# Patient Record
Sex: Female | Born: 1960 | Race: White | Hispanic: No | Marital: Married | Smoking: Current every day smoker
Health system: Southern US, Community
[De-identification: ages and names within clinical notes are randomized; demographics above are authoritative.]

## PROBLEM LIST (undated history)

## (undated) DIAGNOSIS — K219 Gastro-esophageal reflux disease without esophagitis: Secondary | ICD-10-CM

## (undated) DIAGNOSIS — I10 Essential (primary) hypertension: Secondary | ICD-10-CM

---

## 2019-07-14 ENCOUNTER — Encounter: Payer: Self-pay | Admitting: Emergency Medicine

## 2019-07-14 ENCOUNTER — Emergency Department: Payer: Managed Care, Other (non HMO)

## 2019-07-14 ENCOUNTER — Inpatient Hospital Stay
Admission: EM | Admit: 2019-07-14 | Discharge: 2019-07-17 | DRG: 641 | Disposition: A | Discharge status: Home / Self Care | Payer: Managed Care, Other (non HMO) | Attending: Family Medicine | Admitting: Family Medicine

## 2019-07-14 ENCOUNTER — Other Ambulatory Visit: Payer: Self-pay

## 2019-07-14 ENCOUNTER — Ambulatory Visit (INDEPENDENT_AMBULATORY_CARE_PROVIDER_SITE_OTHER)
Admission: EM | Admit: 2019-07-14 | Discharge: 2019-07-14 | Disposition: A | Discharge status: Home / Self Care | Payer: Managed Care, Other (non HMO) | Source: Home / Self Care | Attending: Family Medicine | Admitting: Family Medicine

## 2019-07-14 DIAGNOSIS — R200 Anesthesia of skin: Secondary | ICD-10-CM | POA: Diagnosis present

## 2019-07-14 DIAGNOSIS — E162 Hypoglycemia, unspecified: Secondary | ICD-10-CM | POA: Diagnosis present

## 2019-07-14 DIAGNOSIS — E871 Hypo-osmolality and hyponatremia: Secondary | ICD-10-CM | POA: Diagnosis not present

## 2019-07-14 DIAGNOSIS — Z20828 Contact with and (suspected) exposure to other viral communicable diseases: Secondary | ICD-10-CM | POA: Diagnosis present

## 2019-07-14 DIAGNOSIS — I10 Essential (primary) hypertension: Secondary | ICD-10-CM | POA: Diagnosis present

## 2019-07-14 DIAGNOSIS — Z8249 Family history of ischemic heart disease and other diseases of the circulatory system: Secondary | ICD-10-CM

## 2019-07-14 DIAGNOSIS — Z791 Long term (current) use of non-steroidal anti-inflammatories (NSAID): Secondary | ICD-10-CM

## 2019-07-14 DIAGNOSIS — F17210 Nicotine dependence, cigarettes, uncomplicated: Secondary | ICD-10-CM

## 2019-07-14 DIAGNOSIS — Z79899 Other long term (current) drug therapy: Secondary | ICD-10-CM

## 2019-07-14 DIAGNOSIS — E876 Hypokalemia: Secondary | ICD-10-CM | POA: Diagnosis present

## 2019-07-14 DIAGNOSIS — Z7951 Long term (current) use of inhaled steroids: Secondary | ICD-10-CM

## 2019-07-14 DIAGNOSIS — N179 Acute kidney failure, unspecified: Secondary | ICD-10-CM | POA: Diagnosis present

## 2019-07-14 DIAGNOSIS — K219 Gastro-esophageal reflux disease without esophagitis: Secondary | ICD-10-CM | POA: Diagnosis present

## 2019-07-14 DIAGNOSIS — F17200 Nicotine dependence, unspecified, uncomplicated: Secondary | ICD-10-CM | POA: Diagnosis present

## 2019-07-14 DIAGNOSIS — R202 Paresthesia of skin: Secondary | ICD-10-CM | POA: Diagnosis present

## 2019-07-14 HISTORY — DX: Gastro-esophageal reflux disease without esophagitis: K21.9

## 2019-07-14 HISTORY — DX: Essential (primary) hypertension: I10

## 2019-07-14 LAB — CBC WITH DIFFERENTIAL/PLATELET
Abs Immature Granulocytes: 0.11 10*3/uL — ABNORMAL HIGH (ref 0.00–0.07)
Basophils Absolute: 0.1 10*3/uL (ref 0.0–0.1)
Basophils Relative: 0 %
Eosinophils Absolute: 0 10*3/uL (ref 0.0–0.5)
Eosinophils Relative: 0 %
HCT: 39.4 % (ref 36.0–46.0)
Hemoglobin: 13.8 g/dL (ref 12.0–15.0)
Immature Granulocytes: 1 %
Lymphocytes Relative: 9 %
Lymphs Abs: 1.4 10*3/uL (ref 0.7–4.0)
MCH: 32.5 pg (ref 26.0–34.0)
MCHC: 35 g/dL (ref 30.0–36.0)
MCV: 92.7 fL (ref 80.0–100.0)
Monocytes Absolute: 1 10*3/uL (ref 0.1–1.0)
Monocytes Relative: 7 %
Neutro Abs: 12.6 10*3/uL — ABNORMAL HIGH (ref 1.7–7.7)
Neutrophils Relative %: 83 %
Platelets: 307 10*3/uL (ref 150–400)
RBC: 4.25 MIL/uL (ref 3.87–5.11)
RDW: 13.6 % (ref 11.5–15.5)
WBC: 15.3 10*3/uL — ABNORMAL HIGH (ref 4.0–10.5)
nRBC: 0 % (ref 0.0–0.2)

## 2019-07-14 LAB — COMPREHENSIVE METABOLIC PANEL
ALT: 36 U/L (ref 0–44)
AST: 40 U/L (ref 15–41)
Albumin: 4.2 g/dL (ref 3.5–5.0)
Alkaline Phosphatase: 54 U/L (ref 38–126)
Anion gap: 19 — ABNORMAL HIGH (ref 5–15)
BUN: 14 mg/dL (ref 6–20)
CO2: 18 mmol/L — ABNORMAL LOW (ref 22–32)
Calcium: 6 mg/dL — CL (ref 8.9–10.3)
Chloride: 93 mmol/L — ABNORMAL LOW (ref 98–111)
Creatinine, Ser: 1.28 mg/dL — ABNORMAL HIGH (ref 0.44–1.00)
GFR calc Af Amer: 53 mL/min — ABNORMAL LOW (ref >60)
GFR calc non Af Amer: 46 mL/min — ABNORMAL LOW (ref >60)
Glucose, Bld: 122 mg/dL — ABNORMAL HIGH (ref 70–99)
Potassium: 2.4 mmol/L — CL (ref 3.5–5.1)
Sodium: 130 mmol/L — ABNORMAL LOW (ref 135–145)
Total Bilirubin: 0.7 mg/dL (ref 0.3–1.2)
Total Protein: 7.9 g/dL (ref 6.5–8.1)

## 2019-07-14 LAB — GLUCOSE, CAPILLARY: Glucose-Capillary: 124 mg/dL — ABNORMAL HIGH (ref 70–99)

## 2019-07-14 LAB — MAGNESIUM
Magnesium: 0.5 mg/dL — CL (ref 1.7–2.4)
Magnesium: 1.9 mg/dL (ref 1.7–2.4)

## 2019-07-14 LAB — TROPONIN I (HIGH SENSITIVITY): Troponin I (High Sensitivity): 8 ng/L (ref <18)

## 2019-07-14 LAB — PHOSPHORUS
Phosphorus: 4.1 mg/dL (ref 2.5–4.6)
Phosphorus: 3 mg/dL (ref 2.5–4.6)

## 2019-07-14 LAB — POTASSIUM: Potassium: 2.5 mmol/L — CL (ref 3.5–5.1)

## 2019-07-14 MED ORDER — MAGNESIUM SULFATE 4 GM/100ML IV SOLN: 4.0000 g | Freq: Once | INTRAVENOUS | Status: DC

## 2019-07-14 MED ORDER — ALPRAZOLAM 0.25 MG PO TABS: 0.2500 mg | ORAL_TABLET | Freq: Two times a day (BID) | ORAL | Status: DC | PRN

## 2019-07-14 MED ORDER — ENOXAPARIN SODIUM 40 MG/0.4ML ~~LOC~~ SOLN
40.0000 mg | SUBCUTANEOUS | Status: DC
  Administered 2019-07-15 – 2019-07-16 (×3): 40 mg via SUBCUTANEOUS
  Filled 2019-07-14 (×2): qty 0.4

## 2019-07-14 MED ORDER — ACETAMINOPHEN 325 MG PO TABS: 650.0000 mg | ORAL_TABLET | Freq: Four times a day (QID) | ORAL | Status: DC | PRN

## 2019-07-14 MED ORDER — POTASSIUM CHLORIDE 10 MEQ/100ML IV SOLN: 10.0000 meq | INTRAVENOUS | Status: DC

## 2019-07-14 MED ORDER — POTASSIUM CHLORIDE CRYS ER 20 MEQ PO TBCR: 40.0000 meq | EXTENDED_RELEASE_TABLET | Freq: Once | ORAL | Status: DC

## 2019-07-14 MED ORDER — ADULT MULTIVITAMIN W/MINERALS CH
1.0000 | ORAL_TABLET | Freq: Every day | ORAL | Status: DC
  Administered 2019-07-15 – 2019-07-17 (×3): 1 via ORAL
  Filled 2019-07-14 (×3): qty 1

## 2019-07-14 MED ORDER — CALCIUM GLUCONATE-NACL 1-0.675 GM/50ML-% IV SOLN
1.0000 g | Freq: Once | INTRAVENOUS | Status: AC
  Administered 2019-07-14: 1000 mg via INTRAVENOUS
  Filled 2019-07-14: qty 50

## 2019-07-14 MED ORDER — ACETAMINOPHEN 650 MG RE SUPP
650.0000 mg | Freq: Four times a day (QID) | RECTAL | Status: DC | PRN
  Filled 2019-07-14: qty 1

## 2019-07-14 MED ORDER — LORAZEPAM 2 MG PO TABS: 0.0000 mg | ORAL_TABLET | Freq: Two times a day (BID) | ORAL | Status: DC

## 2019-07-14 MED ORDER — LORAZEPAM 2 MG PO TABS: 0.0000 mg | ORAL_TABLET | Freq: Four times a day (QID) | ORAL | Status: AC

## 2019-07-14 MED ORDER — FOLIC ACID 1 MG PO TABS
1.0000 mg | ORAL_TABLET | Freq: Every day | ORAL | Status: DC
  Administered 2019-07-15 – 2019-07-17 (×3): 1 mg via ORAL
  Filled 2019-07-14 (×3): qty 1

## 2019-07-14 MED ORDER — POTASSIUM CHLORIDE 10 MEQ/100ML IV SOLN
10.0000 meq | INTRAVENOUS | Status: AC
  Administered 2019-07-14 (×3): 10 meq via INTRAVENOUS
  Filled 2019-07-14 (×3): qty 100

## 2019-07-14 MED ORDER — ONDANSETRON HCL 4 MG PO TABS: 4.0000 mg | ORAL_TABLET | Freq: Four times a day (QID) | ORAL | Status: DC | PRN

## 2019-07-14 MED ORDER — MAGNESIUM SULFATE 2 GM/50ML IV SOLN: 2.0000 g | Freq: Once | INTRAVENOUS | Status: DC

## 2019-07-14 MED ORDER — POTASSIUM CHLORIDE IN NACL 20-0.9 MEQ/L-% IV SOLN
INTRAVENOUS | Status: DC
  Administered 2019-07-14 – 2019-07-16 (×4): via INTRAVENOUS
  Filled 2019-07-14 (×8): qty 1000

## 2019-07-14 MED ORDER — VITAMIN B-1 100 MG PO TABS
100.0000 mg | ORAL_TABLET | Freq: Every day | ORAL | Status: DC
  Administered 2019-07-15 – 2019-07-17 (×3): 100 mg via ORAL
  Filled 2019-07-14 (×3): qty 1

## 2019-07-14 MED ORDER — MAGNESIUM SULFATE 4 GM/100ML IV SOLN
4.0000 g | Freq: Once | INTRAVENOUS | Status: AC
  Administered 2019-07-14: 4 g via INTRAVENOUS
  Filled 2019-07-14: qty 100

## 2019-07-14 MED ORDER — THIAMINE HCL 100 MG/ML IJ SOLN: 100.0000 mg | Freq: Every day | INTRAMUSCULAR | Status: DC

## 2019-07-14 MED ORDER — SODIUM CHLORIDE 0.9 % IV SOLN: 1.0000 g | Freq: Once | INTRAVENOUS | Status: DC

## 2019-07-14 MED ORDER — ONDANSETRON HCL 4 MG/2ML IJ SOLN: 4.0000 mg | Freq: Four times a day (QID) | INTRAMUSCULAR | Status: DC | PRN

## 2019-07-14 MED ORDER — SODIUM CHLORIDE 0.9 % IV BOLUS
1000.0000 mL | Freq: Once | INTRAVENOUS | Status: AC
  Administered 2019-07-14: 1000 mL via INTRAVENOUS

## 2019-07-14 NOTE — ED Notes (Signed)
Pt up to restroom to urinate. Will hang fluids/meds once back.

## 2019-07-14 NOTE — Consult Note (Signed)
PHARMACY CONSULT NOTE - FOLLOW UP  Pharmacy Consult for Electrolyte Monitoring and Replacement   Recent Labs: Potassium (mmol/L)  Date Value  07/14/2019 2.4 (LL)   Magnesium (mg/dL)  Date Value  07/14/2019 0.5 (LL)   Calcium (mg/dL)  Date Value  07/14/2019 6.0 (LL)   Albumin (g/dL)  Date Value  07/14/2019 4.2   Phosphorus (mg/dL)  Date Value  07/14/2019 4.1   Sodium (mmol/L)  Date Value  07/14/2019 130 (L)    Assessment: Patient is a 58 y/o F with history of HTN and GERD who presented to [facility] ED c/o numbness in face and hands. Patient reports drinking more alcohol than usual recently. Orders for multivitamin, thiamine, folate. Labs significant for multiple electrolyte abnormalities. Scr is elevated to 1.28.   Electrolyte repletion ordered in ED includes: -Potassium chloride 10 mEq IV x 3 -Potassium chloride 40 mEq PO x 1 -Calcium gluconate 1 g IV x1  Goal of Therapy:  Replete electrolytes to normal levels  Plan:  Hypokalemia: -NS + 20 mEq at 100 mL/hr -Re-check K at 2200  Hypomagnesemia: -Magnesium sulfate 4 g IV x 1  Hypocalcemia -Additional calcium gluconate 1 g IV x1  -BMP, Mg tomorrow AM  [place] Resident 07/14/2019 4:14 PM

## 2019-07-14 NOTE — Discharge Instructions (Signed)
Go to the ER for further evaluation.  Take care  Dr. Lacinda Axon

## 2019-07-14 NOTE — ED Triage Notes (Signed)
Patient c/o numbness in both hands and both sides of her face since yesterday.  Patient denies pain.

## 2019-07-14 NOTE — ED Notes (Signed)
Pt denies any needs currently. Bed remains locked low. Rail remains up. Call bell remains within reach. Pt watching tv.

## 2019-07-14 NOTE — ED Notes (Signed)
Pt speaking with this RN in NAD, reports feeling numb from eyes down to her knees x 2-3 days.

## 2019-07-14 NOTE — ED Provider Notes (Signed)
[facility] Emergency Department Provider Note  ____________________________________________   First MD Initiated Contact with Patient 07/14/19 1452     (approximate)  I have reviewed the triage vital signs and the nursing notes.   HISTORY  Chief Complaint Numbness (started Wednesday)    HPI Marlenne Ridge is a 58 y.o. female otherwise healthy presents with numbness.  Patient says the numbness is on her face below her eyebrows as well as her upper extremities.  Numbness is intermittent, moderate, nothing makes better, nothing makes it worse.  He has been associate with some ringing in her ears.  Patient has tried to put Q-tips into her ears to help with the ringing.  Denies excessive aspirin use.  Denies any weakness or difficulty speaking with it.  Denies having this previously.  Has not had basic labs done in over 10 years.     Past Medical History:  Diagnosis Date  . GERD (gastroesophageal reflux disease)   . Hypertension     There are no active problems to display for this patient.   No past surgical history on file.  Prior to Admission medications   Medication Sig Start Date End Date Taking? Authorizing Provider  ALPRAZolam (XANAX) 0.25 MG tablet Take 0.25 mg by mouth daily as needed. 07/03/19   [provider]  LISINOPRIL PO Take by mouth.    [provider]  METOPROLOL SUCCINATE PO Take by mouth.    [provider]    Allergies Codeine  Family History  Problem Relation Age of Onset  . Hypertension Mother   . Hypertension Father     Social History Social History   Tobacco Use  . Smoking status: Current Every Day Smoker    Types: Cigarettes  . Smokeless tobacco: Never Used  Substance Use Topics  . Alcohol use: Yes  . Drug use: Never      Review of Systems Constitutional: No fever/chills Eyes: No visual changes. ENT: No sore throat. Cardiovascular: Denies chest pain. Respiratory: Denies  shortness of breath. Gastrointestinal: No abdominal pain.  No nausea, no vomiting.  No diarrhea.  No constipation. Genitourinary: Negative for dysuria. Musculoskeletal: Negative for back pain. Skin: Negative for rash. Neurological: Negative for headaches, positive sensation changes. All other ROS negative ____________________________________________   PHYSICAL EXAM:  VITAL SIGNS: ED Triage Vitals  Enc Vitals Group     BP 07/14/19 1353 (!) 128/106     Pulse Rate 07/14/19 1353 79     Resp 07/14/19 1353 (!) 21     Temp 07/14/19 1353 98 F (36.7 C)     Temp Source 07/14/19 1353 Oral     SpO2 07/14/19 1353 98 %     Weight 07/14/19 1354 230 lb (104.3 kg)     Height 07/14/19 1354 5\' 8"  (1.727 m)     Head Circumference --      Peak Flow --      Pain Score 07/14/19 1354 0     Pain Loc --      Pain Edu? --      Excl. in [place]? --     Constitutional: Alert and oriented. Well appearing and in no acute distress. Eyes: Conjunctivae are normal. EOMI. Head: Atraumatic.  TMs slightly red with erythema on the left. No wax noted.  Nose: No congestion/rhinnorhea. Mouth/Throat: Mucous membranes are moist.   Neck: No stridor. Trachea Midline. FROM Cardiovascular: Normal rate, regular rhythm. Grossly normal heart sounds.  Good peripheral circulation. Respiratory: Normal respiratory effort.  No  retractions. Lungs CTAB. Gastrointestinal: Soft and nontender. No distention. No abdominal bruits.  Musculoskeletal: No lower extremity tenderness nor edema.  No joint effusions. Neurologic: Cranial nerves II through XII are intact.  No pronator drift.  Equal strength in arms and legs. Subjective tingling in bilateral arms and cheeks. Skin:  Skin is warm, dry and intact. No rash noted.  Negative chovstek sign.  Psychiatric: Mood and affect are normal. Speech and behavior are normal. GU: Deferred   ____________________________________________   LABS (all labs ordered are listed, but only abnormal results  are displayed)  Labs Reviewed  GLUCOSE, CAPILLARY - Abnormal; Notable for the following components:      Result Value   Glucose-Capillary 124 (*)    All other components within normal limits  CBC WITH DIFFERENTIAL/PLATELET - Abnormal; Notable for the following components:   WBC 15.3 (*)    Neutro Abs 12.6 (*)    Abs Immature Granulocytes 0.11 (*)    All other components within normal limits  COMPREHENSIVE METABOLIC PANEL - Abnormal; Notable for the following components:   Sodium 130 (*)    Potassium 2.4 (*)    Chloride 93 (*)    CO2 18 (*)    Glucose, Bld 122 (*)    Creatinine, Ser 1.28 (*)    Calcium 6.0 (*)    GFR calc non Af Amer 46 (*)    GFR calc Af Amer 53 (*)    Anion gap 19 (*)    All other components within normal limits  MAGNESIUM - Abnormal; Notable for the following components:   Magnesium 0.5 (*)    All other components within normal limits  SARS CORONAVIRUS 2 (TAT 6-24 HRS)  PHOSPHORUS  CALCIUM, IONIZED  PARATHYROID HORMONE, INTACT (NO CA)  CBG MONITORING, ED  TROPONIN I (HIGH SENSITIVITY)   ____________________________________________   ED ECG REPORT I, Concha Se, the attending physician, personally viewed and interpreted this ECG.  EKG is normal sinus rate of 96, no ST elevation, no T wave inversions, normal intervals. ____________________________________________  RADIOLOGY   Official radiology report(s): Ct Head Wo Contrast  Result Date: 07/14/2019 CLINICAL DATA:  Numbness and tingling, paresthesias, acute numbness from her neck down. No known injury. EXAM: CT HEAD WITHOUT CONTRAST TECHNIQUE: Contiguous axial images were obtained from the base of the skull through the vertex without intravenous contrast. COMPARISON:  None. FINDINGS: Brain: No evidence of acute infarction, hemorrhage, hydrocephalus, extra-axial collection or mass lesion/mass effect. 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 Vascular:  No hyperdense vessel or unexpected calcification. Skull: No osseous abnormality. Sinuses/Orbits: Visualized paranasal sinuses are clear. Visualized mastoid sinuses are clear. Visualized orbits demonstrate no focal abnormality. Other: None IMPRESSION: No acute intracranial pathology. Electronically Signed   By: Elige Ko   On: 07/14/2019 14:24    ____________________________________________   PROCEDURES  Procedure(s) performed (including Critical Care):  Procedures   ____________________________________________   INITIAL IMPRESSION / ASSESSMENT AND PLAN / ED COURSE  Zaiah Credeur was evaluated in Emergency Department on 07/14/2019 for the symptoms described in the history of present illness. She was evaluated in the context of the global COVID-19 pandemic, which necessitated consideration that the patient might be at risk for infection with the SARS-CoV-2 virus that causes COVID-19. Institutional protocols and algorithms that pertain to the evaluation of patients at risk for COVID-19 are in a state of rapid change based on information released by regulatory bodies including the CDC and federal and state organizations.  These policies and algorithms were followed during the patient's care in the ED.     Patient is a 58 year old who presents with face and arm numbness that is intermittent in nature.  Will get labs to evaluate for electrolyte abnormalities, AKI.  No focal weakness on exam to suggest stroke but CT head was ordered in triage to evaluate for intracranial hemorrhage versus mass.   Labs notable for K 2.4  Cr 1.28 And Ca 6.0  AG 19  Mag low at 0.5  White count elevated but no infectious symptoms.   We will add on ionized calcium that will not result for few days.  Will also add on magnesium and phosphorus.  Will begin to replete electrolytes.  Given significant electrolyte abnormalities causing symptoms will discuss with the hospital team for admission.     ____________________________________________   FINAL CLINICAL IMPRESSION(S) / ED DIAGNOSES   Final diagnoses:  Hypocalcemia  Hypokalemia  Hypomagnesemia  AKI (acute kidney injury) (HCC)      MEDICATIONS GIVEN DURING THIS VISIT:  Medications  sodium chloride 0.9 % bolus 1,000 mL (has no administration in time range)  potassium chloride SA (KLOR-CON) CR tablet 40 mEq (has no administration in time range)  potassium chloride 10 mEq in 100 mL IVPB (has no administration in time range)  calcium gluconate 1 g/ 50 mL sodium chloride IVPB (has no administration in time range)  magnesium sulfate IVPB 2 g 50 mL (has no administration in time range)     ED Discharge Orders    None       Note:  This document was prepared using Dragon voice recognition software and may include unintentional dictation errors.   Concha SeFunke, Aedin Jeansonne E, MD 07/14/19 1556

## 2019-07-14 NOTE — ED Notes (Signed)
This RN will call floor again in about 57mins.

## 2019-07-14 NOTE — ED Notes (Signed)
Pt up to restroom to urinate.  

## 2019-07-14 NOTE — H&P (Signed)
[place] [facility] [place] NAME: Mariah Bautista    MR#:  [id number]  DATE OF BIRTH:  [birth year]  DATE OF ADMISSION:  07/14/2019  PRIMARY CARE PHYSICIAN: Volanda Napoleon, MD   REQUESTING/REFERRING PHYSICIAN:Dr Jari Pigg  CHIEF COMPLAINT:   Feeling numbness all over the body for last few days  HISTORY OF PRESENT ILLNESS:  Mariah Bautista  is a 58 y.o. female with a known history of hypertension, Jerrye Bushy who is a daily alcohol drinker drinks few beers a day lately has been drinking more which she attributes to staying home more with COVID. Comes to the emergency room with weakness and generalized numbness all over. Denies any focal weakness vision disturbance or any dysphagia. Patient was found to have significant electrolyte abnormality with low potassium, sodium, magnesium and calcium. She is being admitted for further evaluation management. CT head is negative for stroke.  Denies any chest pain or shortness of breath.  PAST MEDICAL HISTORY:   Past Medical History:  Diagnosis Date  . GERD (gastroesophageal reflux disease)   . Hypertension     PAST SURGICAL HISTOIRY:  No past surgical history on file.  SOCIAL HISTORY:   Social History   Tobacco Use  . Smoking status: Current Every Day Smoker    Types: Cigarettes  . Smokeless tobacco: Never Used  Substance Use Topics  . Alcohol use: Yes    FAMILY HISTORY:   Family History  Problem Relation Age of Onset  . Hypertension Mother   . Hypertension Father     DRUG ALLERGIES:   Allergies  Allergen Reactions  . Codeine Nausea And Vomiting    REVIEW OF SYSTEMS:  Review of Systems  Constitutional: Negative for chills, fever and weight loss.  HENT: Negative for ear discharge, ear pain and nosebleeds.   Eyes: Negative for blurred vision, pain and discharge.  Respiratory: Negative for sputum production, shortness of breath, wheezing and stridor.   Cardiovascular: Negative for chest  pain, palpitations, orthopnea and PND.  Gastrointestinal: Negative for abdominal pain, diarrhea, nausea and vomiting.  Genitourinary: Negative for frequency and urgency.  Musculoskeletal: Negative for back pain and joint pain.  Neurological: Positive for tingling and weakness. Negative for sensory change, speech change and focal weakness.       Numbness  Psychiatric/Behavioral: Negative for depression and hallucinations. The patient is not nervous/anxious.      MEDICATIONS AT HOME:   Prior to Admission medications   Medication Sig Start Date End Date Taking? Authorizing Provider  ALPRAZolam (XANAX) 0.25 MG tablet Take 0.25 mg by mouth daily as needed. 07/03/19  Yes [provider]  cholecalciferol (VITAMIN D3) 25 MCG (1000 UT) tablet Take 1,000 Units by mouth daily.   Yes [provider]  fexofenadine (ALLEGRA) 180 MG tablet Take 180 mg by mouth daily.   Yes [provider]  fluticasone (FLONASE) 50 MCG/ACT nasal spray Place 1 spray into both nostrils daily.   Yes [provider]  lisinopril-hydrochlorothiazide (ZESTORETIC) 20-12.5 MG tablet Take 2 tablets by mouth daily.   Yes [provider]  metoprolol succinate (TOPROL XL) 100 MG 24 hr tablet Take 100 mg by mouth daily.   Yes [provider]  naproxen sodium (ALEVE) 220 MG tablet Take 440 mg by mouth daily as needed.   Yes [provider]  omeprazole (PRILOSEC) 40 MG capsule Take 40 mg by mouth daily.   Yes [provider]  vitamin C (ASCORBIC ACID) 250 MG tablet Take 250  mg by mouth daily.   Yes [provider]      VITAL SIGNS:  Blood pressure (!) 128/106, pulse 79, temperature 98 F (36.7 C), temperature source Oral, resp. rate (!) 21, height 5\' 8"  (1.727 m), weight 104.3 kg, SpO2 98 %.  PHYSICAL EXAMINATION:  GENERAL:  58 y.o.-year-old patient lying in the bed with no acute distress. Morbid obesity EYES: Pupils equal, round, reactive to light and  accommodation. No scleral icterus. Extraocular muscles intact.  HEENT: Head atraumatic, normocephalic. Oropharynx and nasopharynx clear.  NECK:  Supple, no jugular venous distention. No thyroid enlargement, no tenderness.  LUNGS: Normal breath sounds bilaterally, no wheezing, rales,rhonchi or crepitation. No use of accessory muscles of respiration.  CARDIOVASCULAR: S1, S2 normal. No murmurs, rubs, or gallops.  ABDOMEN: Soft, nontender, nondistended. Bowel sounds present. No organomegaly or mass.  EXTREMITIES: No pedal edema, cyanosis, or clubbing.  NEUROLOGIC: Cranial nerves II through XII are intact. Muscle strength 5/5 in all extremities. Sensation intact. Gait not checked.  PSYCHIATRIC: The patient is alert and oriented x 3.  SKIN: No obvious rash, lesion, or ulcer.   LABORATORY PANEL:   CBC Recent Labs  Lab 07/14/19 1358  WBC 15.3*  HGB 13.8  HCT 39.4  PLT 307   ------------------------------------------------------------------------------------------------------------------  Chemistries  Recent Labs  Lab 07/14/19 1358 07/14/19 1459  NA 130*  --   K 2.4*  --   CL 93*  --   CO2 18*  --   GLUCOSE 122*  --   BUN 14  --   CREATININE 1.28*  --   CALCIUM 6.0*  --   MG  --  0.5*  AST 40  --   ALT 36  --   ALKPHOS 54  --   BILITOT 0.7  --    ------------------------------------------------------------------------------------------------------------------  Cardiac Enzymes No results for input(s): TROPONINI in the last 168 hours. ------------------------------------------------------------------------------------------------------------------  RADIOLOGY:  Ct Head Wo Contrast  Result Date: 07/14/2019 CLINICAL DATA:  Numbness and tingling, paresthesias, acute numbness from her neck down. No known injury. EXAM: CT HEAD WITHOUT CONTRAST TECHNIQUE: Contiguous axial images were obtained from the base of the skull through the vertex without intravenous contrast. COMPARISON:   None. FINDINGS: Brain: No evidence of acute infarction, hemorrhage, hydrocephalus, extra-axial collection or mass lesion/mass effect. Small cystic area adjacent to the right thalamus likely reflecting a Norris CrossVirchow Robin space. Vascular: No hyperdense vessel or unexpected calcification. Skull: No osseous abnormality. Sinuses/Orbits: Visualized paranasal sinuses are clear. Visualized mastoid sinuses are clear. Visualized orbits demonstrate no focal abnormality. Other: None IMPRESSION: No acute intracranial pathology. Electronically Signed   By: Elige KoHetal  Kaiyan Luczak   On: 07/14/2019 14:24    EKG:    IMPRESSION AND PLAN:   Mariah Bautista  is a 58 y.o. female with a known history of hypertension, Genella RifeGerd who is a daily alcohol drinker drinks few beers a day lately has been drinking more which she attributes to staying home more with COVID. Complains of generalized numbness and weakness. Denies any tremors, focal weakness or dysphagia.  1. Generalized numbness with tingling suspected due to electrolyte abnormality -patient has hypokalemia, hypoglycemia, hypocalcemia -pharmacy consult place for electrolyte replacement both IV and oral -monitor metabolic panel -will give oral supplement at discharge -CT had negative for stroke.  2. Alcohol use on a daily basis which is increased recently which patient attributes to COVID -denies any DTs or alcohol related problems -CIWA protocol -watch for withdrawal -MVI, thiamine and folic acid  3. Hypertension - continue lisinopril  and metoprolol  4. Genella Rife continue PPI  5. DVT prophylaxis subcu Lovenox   All the records are reviewed and case discussed with ED provider.   CODE STATUS: full  TOTAL TIME TAKING CARE OF THIS PATIENT: *50* minutes.    Enedina Finner M.D on 07/14/2019 at 4:57 PM  Between 7am to 6pm - Pager - [phone]  After 6pm go to [email or web address] - password [email or web address] [facility]  [facility]  Office  [phone]  CC: Primary care physician;  [place] Bells, MD

## 2019-07-14 NOTE — ED Provider Notes (Signed)
[facility]    CSN: [id number] Arrival date & time: 07/14/19  1252  History   Chief Complaint Chief Complaint  Patient presents with  . Numbness    face and hands   HPI  58 year old female presents with the above complaint.   Patient reports that she developed diffuse numbness yesterday.  Has persisted today.  Patient states it is located essentially from her face down.  Does not affect the forehead.  It is bilateral.  She states that her face is numb as well as her arms.  She states that she has numbness of her legs particularly with prolonged sitting.  Denies weakness.  She has been able to do numerous household chores today without difficulty.  Patient denies being anxious but appears anxious currently.  Denies increase in stressors.  No known inciting factor.  No known exacerbating or relieving factors.  No other complaints.  PMH, Surgical Hx, Family Hx, Social History reviewed and updated as below.  Past Medical History:  Diagnosis Date  . GERD (gastroesophageal reflux disease)   . Hypertension   Tobacco abuse  History reviewed. No pertinent surgical history.  OB History   No obstetric history on file.    Home Medications    Prior to Admission medications   Medication Sig Start Date End Date Taking? Authorizing Provider  ALPRAZolam (XANAX) 0.25 MG tablet Take 0.25 mg by mouth daily as needed. 07/03/19  Yes [provider]  LISINOPRIL PO Take by mouth.   Yes [provider]  METOPROLOL SUCCINATE PO Take by mouth.   Yes [provider]    Family History Family History  Problem Relation Age of Onset  . Hypertension Mother   . Hypertension Father     Social History Social History   Tobacco Use  . Smoking status: Current Every Day Smoker    Types: Cigarettes  . Smokeless tobacco: Never Used  Substance Use Topics  . Alcohol use: Yes  . Drug use: Never    Allergies   Codeine   Review of Systems Review of Systems   Constitutional: Negative.   Neurological: Positive for numbness. Negative for weakness.   Physical Exam Triage Vital Signs ED Triage Vitals  Enc Vitals Group     BP 07/14/19 1255 126/82     Pulse Rate 07/14/19 1255 (!) 107     Resp 07/14/19 1255 16     Temp 07/14/19 1255 97.8 F (36.6 C)     Temp Source 07/14/19 1255 Oral     SpO2 07/14/19 1255 100 %     Weight 07/14/19 1255 230 lb (104.3 kg)     Height 07/14/19 1255 5\' 8"  (1.727 m)     Head Circumference --      Peak Flow --      Pain Score 07/14/19 1254 0     Pain Loc --      Pain Edu? --      Excl. in [place]? --    Updated Vital Signs BP 126/82 (BP Location: Left Arm)   Pulse (!) 107   Temp 97.8 F (36.6 C) (Oral)   Resp 16   Ht 5\' 8"  (1.727 m)   Wt 104.3 kg   SpO2 100%   BMI 34.97 kg/m   Visual Acuity Right Eye Distance:   Left Eye Distance:   Bilateral Distance:    Right Eye Near:   Left Eye Near:    Bilateral Near:     Physical Exam Vitals  signs and nursing note reviewed.  Constitutional:      General: She is not in acute distress.    Appearance: She is obese.  HENT:     Head: Normocephalic and atraumatic.  Eyes:     General:        Right eye: No discharge.        Left eye: No discharge.     Conjunctiva/sclera: Conjunctivae normal.  Cardiovascular:     Rate and Rhythm: Regular rhythm. Tachycardia present.  Pulmonary:     Effort: Pulmonary effort is normal.     Breath sounds: Normal breath sounds. No wheezing, rhonchi or rales.  Neurological:     Mental Status: She is alert and oriented to person, place, and time.     Comments: No appreciable weakness.  Psychiatric:     Comments: Appears anxious.    UC Treatments / Results  Labs (all labs ordered are listed, but only abnormal results are displayed) Labs Reviewed - No data to display  EKG   Radiology No results found.  Procedures Procedures (including critical care time)  Medications Ordered in UC Medications - No data to display   Initial Impression / Assessment and Plan / UC Course  I have reviewed the triage vital signs and the nursing notes.  Pertinent labs & imaging results that were available during my care of the patient were reviewed by me and considered in my medical decision making (see chart for details).    58 year old female presents with diffuse numbness.  Does not appear to be having an acute ischemic event.  There is no weakness or other neurological signs of stroke.  Patient anxious.  We discussed going to the ER versus watchful waiting as there is no focality to her exam.  Patient elected to go to the ER.  Patient is going via private vehicle.  Final Clinical Impressions(s) / UC Diagnoses   Final diagnoses:  Numbness     Discharge Instructions     Go to the ER for further evaluation.  Take care  Dr. Lacinda Axon    ED Prescriptions    None     PDMP not reviewed this encounter.   Coral Spikes, [place] 07/14/19 1339

## 2019-07-14 NOTE — ED Notes (Signed)
Pt up to use restroom. This RN to transport to room 102 once pt done.

## 2019-07-14 NOTE — ED Triage Notes (Signed)
PT arrives with complaints of numbness from face down that started Wednesday. Pt was able to feel sensation on both arms and legs upon assessment. No complaints of pain. No weakness present or reported. Pt a&o x 4 in triage. Smile symmetrical.

## 2019-07-14 NOTE — ED Notes (Signed)
SST tube sent to lab

## 2019-07-14 NOTE — ED Notes (Addendum)
Pt given food tray and drink. Watching tv. Denies any other needs.

## 2019-07-14 NOTE — ED Notes (Signed)
Ok to d/c 2nd trop per Dr. Funke 

## 2019-07-14 NOTE — ED Notes (Signed)
Pt denies burning from potassium; requesting pillow to place arm on since ac IV. Given pillow and arm placed on top. States feels much better this way. Denies any other needs.

## 2019-07-14 NOTE — ED Notes (Signed)
Pt given 2nd pillow to place at lower back as states the stretcher is uncomfortable. Given. States feels better once in place. Pt watching tv.

## 2019-07-15 LAB — MAGNESIUM: Magnesium: 1.6 mg/dL — ABNORMAL LOW (ref 1.7–2.4)

## 2019-07-15 LAB — BASIC METABOLIC PANEL WITH GFR
Anion gap: 14 (ref 5–15)
BUN: 8 mg/dL (ref 6–20)
CO2: 19 mmol/L — ABNORMAL LOW (ref 22–32)
Calcium: 6.2 mg/dL — CL (ref 8.9–10.3)
Chloride: 102 mmol/L (ref 98–111)
Creatinine, Ser: 1.04 mg/dL — ABNORMAL HIGH (ref 0.44–1.00)
GFR calc Af Amer: >60 mL/min
GFR calc non Af Amer: 59 mL/min — ABNORMAL LOW
Glucose, Bld: 114 mg/dL — ABNORMAL HIGH (ref 70–99)
Potassium: 2.6 mmol/L — CL (ref 3.5–5.1)
Sodium: 135 mmol/L (ref 135–145)

## 2019-07-15 LAB — SARS CORONAVIRUS 2 (TAT 6-24 HRS): SARS Coronavirus 2: NEGATIVE

## 2019-07-15 LAB — CALCIUM, IONIZED: Calcium, Ionized, Serum: <3 mg/dL — ABNORMAL LOW (ref 4.5–5.6)

## 2019-07-15 LAB — POTASSIUM: Potassium: 3.2 mmol/L — ABNORMAL LOW (ref 3.5–5.1)

## 2019-07-15 LAB — PARATHYROID HORMONE, INTACT (NO CA): PTH: 16 pg/mL (ref 15–65)

## 2019-07-15 MED ORDER — POTASSIUM CHLORIDE 10 MEQ/100ML IV SOLN: 10.0000 meq | INTRAVENOUS | Status: DC

## 2019-07-15 MED ORDER — POTASSIUM CHLORIDE 10 MEQ/100ML IV SOLN
10.0000 meq | INTRAVENOUS | Status: AC
  Administered 2019-07-15 (×2): 10 meq via INTRAVENOUS
  Filled 2019-07-15 (×2): qty 100

## 2019-07-15 MED ORDER — MAGNESIUM SULFATE 4 GM/100ML IV SOLN
4.0000 g | Freq: Once | INTRAVENOUS | Status: AC
  Administered 2019-07-15: 13:00:00 4 g via INTRAVENOUS
  Filled 2019-07-15: qty 100

## 2019-07-15 MED ORDER — POTASSIUM CHLORIDE 10 MEQ/100ML IV SOLN
10.0000 meq | INTRAVENOUS | Status: AC
  Administered 2019-07-15 (×2): 10 meq via INTRAVENOUS
  Filled 2019-07-15 (×2): qty 100

## 2019-07-15 MED ORDER — CALCIUM GLUCONATE-NACL 1-0.675 GM/50ML-% IV SOLN
1.0000 g | Freq: Once | INTRAVENOUS | Status: AC
  Administered 2019-07-15: 13:00:00 1000 mg via INTRAVENOUS
  Filled 2019-07-15: qty 50

## 2019-07-15 MED ORDER — POTASSIUM CHLORIDE CRYS ER 20 MEQ PO TBCR
20.0000 meq | EXTENDED_RELEASE_TABLET | ORAL | Status: AC
  Administered 2019-07-15 (×2): 20 meq via ORAL
  Filled 2019-07-15 (×2): qty 1

## 2019-07-15 MED ORDER — POTASSIUM CHLORIDE 10 MEQ/100ML IV SOLN
10.0000 meq | INTRAVENOUS | Status: AC
  Administered 2019-07-15 (×4): 10 meq via INTRAVENOUS
  Filled 2019-07-15: qty 100

## 2019-07-15 NOTE — Progress Notes (Signed)
[facility] [place] at [facility]   PATIENT NAME: Mariah Bautista    MR#:  [id number]  DATE OF BIRTH:  30-Jan-1961  SUBJECTIVE:  CHIEF COMPLAINT: Patient feels better than yesterday her numbness is improving.  Last drink was 4 days ago.  Denies any tremors or jitteriness at this point of time  REVIEW OF SYSTEMS:  CONSTITUTIONAL: No fever, fatigue or weakness.  EYES: No blurred or double vision.  EARS, NOSE, AND THROAT: No tinnitus or ear pain.  RESPIRATORY: No cough, shortness of breath, wheezing or hemoptysis.  CARDIOVASCULAR: No chest pain, orthopnea, edema.  GASTROINTESTINAL: No nausea, vomiting, diarrhea or abdominal pain.  GENITOURINARY: No dysuria, hematuria.  ENDOCRINE: No polyuria, nocturia,  HEMATOLOGY: No anemia, easy bruising or bleeding SKIN: No rash or lesion. MUSCULOSKELETAL: No joint pain or arthritis.   NEUROLOGIC: No tingling, numbness, weakness.  PSYCHIATRY: No anxiety or depression.   DRUG ALLERGIES:   Allergies  Allergen Reactions  . Codeine Nausea And Vomiting    VITALS:  Blood pressure 133/74, pulse 79, temperature 97.7 F (36.5 C), temperature source Oral, resp. rate 17, height 5\' 9"  (1.753 m), weight 102.7 kg, SpO2 98 %.  PHYSICAL EXAMINATION:  GENERAL:  58 y.o.-year-old patient lying in the bed with no acute distress.  EYES: Pupils equal, round, reactive to light and accommodation. No scleral icterus. Extraocular muscles intact.  HEENT: Head atraumatic, normocephalic. Oropharynx and nasopharynx clear.  NECK:  Supple, no jugular venous distention. No thyroid enlargement, no tenderness.  LUNGS: Normal breath sounds bilaterally, no wheezing, rales,rhonchi or crepitation. No use of accessory muscles of respiration.  CARDIOVASCULAR: S1, S2 normal. No murmurs, rubs, or gallops.  ABDOMEN: Soft, nontender, nondistended. Bowel sounds present.   EXTREMITIES: No pedal edema, cyanosis, or clubbing.  NEUROLOGIC: Cranial nerves II through  XII are intact. Muscle strength generalized weakness in all extremities. Sensation intact. Gait not checked.  PSYCHIATRIC: The patient is alert and oriented x 3.  SKIN: No obvious rash, lesion, or ulcer.    LABORATORY PANEL:   CBC Recent Labs  Lab 07/14/19 1358  WBC 15.3*  HGB 13.8  HCT 39.4  PLT 307   ------------------------------------------------------------------------------------------------------------------  Chemistries  Recent Labs  Lab 07/14/19 1358  07/15/19 0851  NA 130*  --  135  K 2.4*   < > 2.6*  CL 93*  --  102  CO2 18*  --  19*  GLUCOSE 122*  --  114*  BUN 14  --  8  CREATININE 1.28*  --  1.04*  CALCIUM 6.0*  --  6.2*  MG  --    < > 1.6*  AST 40  --   --   ALT 36  --   --   ALKPHOS 54  --   --   BILITOT 0.7  --   --    < > = values in this interval not displayed.   ------------------------------------------------------------------------------------------------------------------  Cardiac Enzymes No results for input(s): TROPONINI in the last 168 hours. ------------------------------------------------------------------------------------------------------------------  RADIOLOGY:  Ct Head Wo Contrast  Result Date: 07/14/2019 CLINICAL DATA:  Numbness and tingling, paresthesias, acute numbness from her neck down. No known injury. EXAM: CT HEAD WITHOUT CONTRAST TECHNIQUE: Contiguous axial images were obtained from the base of the skull through the vertex without intravenous contrast. COMPARISON:  None. FINDINGS: Brain: No evidence of acute infarction, hemorrhage, hydrocephalus, extra-axial collection or mass lesion/mass effect. Small cystic area adjacent to the right thalamus likely reflecting a 07/16/2019 space. Vascular: No hyperdense  vessel or unexpected calcification. Skull: No osseous abnormality. Sinuses/Orbits: Visualized paranasal sinuses are clear. Visualized mastoid sinuses are clear. Visualized orbits demonstrate no focal abnormality. Other:  None IMPRESSION: No acute intracranial pathology. Electronically Signed   By: Kathreen Devoid   On: 07/14/2019 14:24    EKG:   Orders placed or performed during the hospital encounter of 07/14/19  . ED EKG  . ED EKG    ASSESSMENT AND PLAN:   Mariah Bautista  is a 58 y.o. female with a known history of hypertension, Jerrye Bushy who is a daily alcohol drinker drinks few beers a day lately has been drinking more which she attributes to staying home more with COVID. Complains of generalized numbness and weakness. Denies any tremors, focal weakness or dysphagia.  1. Generalized numbness with tingling suspected due to electrolyte abnormality -patient has hypokalemia potassium at 2.6, hypomagnesemia 1.6, hypocalcemia with calcium and 6.2 -Hypoglycemia resolved -pharmacy consult placed for electrolyte replacement both IV and oral -will give oral supplement at discharge -CT had negative for stroke. -Clinically improving -Monitor electrolytes closely  2. Alcohol use on a daily basis which is increased recently which patient attributes to COVID -denies any DTs or alcohol related problems -CIWA protocol -watch for withdrawal -MVI, thiamine and folic acid -Outpatient alcohol [facility]  3. Hypertension - continue lisinopril and metoprolol  4. Jerrye Bushy continue PPI  5. DVT prophylaxis subcu Lovenox  PT assessment offered patient is refusing physical therapy assessment   All the records are reviewed and case discussed with Care Management/Social Workerr. Management plans discussed with the patient, family and they are in agreement.  CODE STATUS: fc  TOTAL TIME TAKING CARE OF THIS PATIENT: 36  minutes.   POSSIBLE D/C IN 1-2  DAYS, DEPENDING ON CLINICAL CONDITION.  Note: This dictation was prepared with Dragon dictation along with smaller phrase technology. Any transcriptional errors that result from this process are unintentional.   Nicholes Mango M.D on 07/15/2019 at 1:13 PM  Between 7am to  6pm - Pager - [phone] After 6pm go to [email or web address] - password [email or web address] [place] [facility]  Office  [phone]  CC: Primary care physician; Volanda Napoleon, MD

## 2019-07-15 NOTE — Plan of Care (Signed)

## 2019-07-15 NOTE — Progress Notes (Signed)
Patient's potassium 2.5, notified Dr. Jannifer Franklin, received verbal order. See orders.

## 2019-07-15 NOTE — Consult Note (Addendum)
PHARMACY CONSULT NOTE - FOLLOW UP  Pharmacy Consult for Electrolyte Monitoring and Replacement   Recent Labs: Potassium (mmol/L)  Date Value  07/15/2019 2.6 (LL)   Magnesium (mg/dL)  Date Value  07/15/2019 1.6 (L)   Calcium (mg/dL)  Date Value  07/15/2019 6.2 (LL)   Albumin (g/dL)  Date Value  07/14/2019 4.2   Phosphorus (mg/dL)  Date Value  07/14/2019 3.0   Sodium (mmol/L)  Date Value  07/15/2019 135    Assessment: Patient is a 58 y/o F with history of HTN and GERD who presented to [facility] ED c/o numbness in face and hands. Patient reports drinking more alcohol than usual recently. Orders for multivitamin, thiamine, folate. Labs significant for multiple electrolyte abnormalities. Scr is elevated to 1.28.   Electrolyte repletion ordered in ED includes: -Potassium chloride 10 mEq IV x 3 -Potassium chloride 40 mEq PO x 1 -Calcium gluconate 1 g IV x1  Goal of Therapy:  Replete electrolytes to normal levels  Plan:  Hypokalemia: K 2.6   Scr 1.04 -currently on NS + 20 mEq at 100 mL/hr -will order KCL 10 meq IV x 2 (over 2 hrs as pt didn't tolerate well previously) and KCL PO 20 Meq x 2 -f/u K at 2000  Hypomagnesemia: Mag 1.6 -Magnesium sulfate 4 g IV x 1 again today  Hypocalcemia :Ca 6.2 Albumin 4.2 -Additional calcium gluconate 1 g IV x1 again today  -BMP, Mg tomorrow AM  Chinita Greenland PharmD Clinical Pharmacist 07/15/2019

## 2019-07-15 NOTE — Consult Note (Addendum)
PHARMACY CONSULT NOTE - FOLLOW UP  Pharmacy Consult for Electrolyte Monitoring and Replacement   Recent Labs: Potassium (mmol/L)  Date Value  07/15/2019 3.2 (L)   Magnesium (mg/dL)  Date Value  07/15/2019 1.6 (L)   Calcium (mg/dL)  Date Value  07/15/2019 6.2 (LL)   Albumin (g/dL)  Date Value  07/14/2019 4.2   Phosphorus (mg/dL)  Date Value  07/14/2019 3.0   Sodium (mmol/L)  Date Value  07/15/2019 135    Assessment: Patient is a 58 y/o F with history of HTN and GERD who presented to [facility] ED c/o numbness in face and hands. Patient reports drinking more alcohol than usual recently. Orders for multivitamin, thiamine, folate. Labs significant for multiple electrolyte abnormalities. Scr is elevated to 1.28.   Electrolyte repletion ordered in ED includes: -Potassium chloride 10 mEq IV x 3 -Potassium chloride 40 mEq PO x 1 -Calcium gluconate 1 g IV x1  Goal of Therapy:  Replete electrolytes to normal levels  Plan:  Hypokalemia: K 3.2(@1800 )   Scr 1.04 -currently on NS + 20 mEq at 100 mL/hr -will order KCL 10 meq IV x 2 (over 2 hrs as pt didn't tolerate well previously)  -f/u K with AM labs  Hypomagnesemia: Mag 1.6 -Magnesium sulfate 4 g IV x 1 again today  Hypocalcemia :Ca 6.2 Albumin 4.2 -Additional calcium gluconate 1 g IV x1 again today  -BMP, Mg tomorrow AM  Chinita Greenland PharmD Clinical Pharmacist 07/15/2019

## 2019-07-15 NOTE — Progress Notes (Signed)
Patient unable to tolerate potassium rate at 174ml/hr, decreased rate to 105ml/hr. Discussed with lab, will reschedule lab draw for BMP.

## 2019-07-16 DIAGNOSIS — E876 Hypokalemia: Secondary | ICD-10-CM

## 2019-07-16 LAB — BASIC METABOLIC PANEL
Anion gap: 8 (ref 5–15)
BUN: 5 mg/dL — ABNORMAL LOW (ref 6–20)
CO2: 21 mmol/L — ABNORMAL LOW (ref 22–32)
Calcium: 6.7 mg/dL — ABNORMAL LOW (ref 8.9–10.3)
Chloride: 109 mmol/L (ref 98–111)
Creatinine, Ser: 0.76 mg/dL (ref 0.44–1.00)
GFR calc Af Amer: >60 mL/min (ref >60)
GFR calc non Af Amer: >60 mL/min (ref >60)
Glucose, Bld: 112 mg/dL — ABNORMAL HIGH (ref 70–99)
Potassium: 3.1 mmol/L — ABNORMAL LOW (ref 3.5–5.1)
Sodium: 138 mmol/L (ref 135–145)

## 2019-07-16 LAB — POTASSIUM: Potassium: 3.5 mmol/L (ref 3.5–5.1)

## 2019-07-16 LAB — MAGNESIUM: Magnesium: 2.1 mg/dL (ref 1.7–2.4)

## 2019-07-16 MED ORDER — SALINE SPRAY 0.65 % NA SOLN
1.0000 | NASAL | Status: DC | PRN
  Administered 2019-07-16: 1 via NASAL
  Filled 2019-07-16: qty 44

## 2019-07-16 MED ORDER — METOPROLOL SUCCINATE ER 50 MG PO TB24
100.0000 mg | ORAL_TABLET | Freq: Every day | ORAL | Status: DC
  Administered 2019-07-16 – 2019-07-17 (×2): 100 mg via ORAL
  Filled 2019-07-16 (×2): qty 2

## 2019-07-16 MED ORDER — CALCIUM GLUCONATE-NACL 1-0.675 GM/50ML-% IV SOLN
1.0000 g | Freq: Once | INTRAVENOUS | Status: AC
  Administered 2019-07-16: 09:00:00 1000 mg via INTRAVENOUS
  Filled 2019-07-16: qty 50

## 2019-07-16 MED ORDER — POTASSIUM CHLORIDE CRYS ER 20 MEQ PO TBCR
40.0000 meq | EXTENDED_RELEASE_TABLET | ORAL | Status: AC
  Administered 2019-07-16 (×2): 40 meq via ORAL
  Filled 2019-07-16 (×2): qty 2

## 2019-07-16 MED ORDER — LORATADINE 10 MG PO TABS
10.0000 mg | ORAL_TABLET | Freq: Every day | ORAL | Status: DC
  Administered 2019-07-16 – 2019-07-17 (×2): 10 mg via ORAL
  Filled 2019-07-16 (×2): qty 1

## 2019-07-16 NOTE — Progress Notes (Addendum)
  PROGRESS NOTE  Mariah Bautista FOY:[id number] DOB: May 10, 1961 DOA: 07/14/2019 PCP: Volanda Napoleon, MD  Brief History   58 year old woman daily alcohol use presented with weakness and generalized numbness, found to have multiple electrolyte abnormalities including hypokalemia, hyponatremia, hypomagnesemia and hypocalcemia.  A & P  Hypokalemia, hypocalcemia, likely secondary to poor solute intake/alcohol use/"beer drinker's potomania".  Presented with generalized numbness secondary to electrolyte abnormalities.  CT head was negative. --Magnesium within normal limits today.  Phosphorus was normal 48 hours ago. --Continue to supplement potassium and calcium.  Daily alcohol use --No evidence of withdrawal --Continue CIWA  Essential hypertension --Stable.  Resume Toprol-XL.  Hold lisinopril, HCTZ for now  DVT prophylaxis: enoxaparin Code Status: Full Family Communication: none Disposition Plan: home    Murray Hodgkins, MD  [facility] Direct contact: see [email or web address] (further directions at bottom of note if needed) 7PM-7AM contact night coverage as at bottom of note 07/16/2019, 10:56 AM  LOS: 2 days   Significant Hospital Events   . 10/30 admitted for multiple electrolyte abnormalities   Consults:  .    Procedures:  .   Significant Diagnostic Tests:  . Admission potassium 2.4, calcium 6, sodium 130, chloride 93 . CT head no acute abnormalities   Micro Data:  . SARS Covid negative   Antimicrobials:  .   Interval History/Subjective  Feels fine, no nausea or vomiting.  Objective   Vitals:  Vitals:   07/16/19 0349 07/16/19 0846  BP: (!) 152/74 (!) 145/80  Pulse: 81 89  Resp: 17   Temp: 98.5 F (36.9 C)   SpO2: 98% 98%    Exam:  Constitutional: Appears calm, comfortable resting in bed ENT: Grossly normal hearing Respiratory: Clear to auscultation bilaterally.  No wheezes, rales or rhonchi.  Normal respiratory effort. Cardiovascular: Regular rate and  rhythm.  No murmur, rub or gallop.  No lower extremity edema. Abdomen: Soft, nontender, nondistended Psychiatric: Grossly normal mood and affect.  Speech fluent and appropriate.  I have personally reviewed the following:   Today's Data  . Potassium 3.1 . Calcium 6.7 . Magnesium 2.1  Scheduled Meds: . enoxaparin (LOVENOX) injection  40 mg Subcutaneous Q24H  . folic acid  1 mg Oral Daily  . LORazepam  0-4 mg Oral Q6H   Followed by  . LORazepam  0-4 mg Oral Q12H  . metoprolol succinate  100 mg Oral Daily  . multivitamin with minerals  1 tablet Oral Daily  . potassium chloride  40 mEq Oral Q4H  . thiamine  100 mg Oral Daily   Or  . thiamine  100 mg Intravenous Daily   Continuous Infusions:   Principal Problem:   Hypokalemia Active Problems:   Numbness   Hypocalcemia   LOS: 2 days   How to contact the [facility] Attending or Consulting provider [place] or covering provider during after hours [place], for this patient?  1. Check the care team in [facility] and look for a) attending/consulting TRH provider listed and b) the [facility] team listed 2. Log into [email or web address] and use [place]'s universal password to access. If you do not have the password, please contact the hospital operator. 3. Locate the [facility] provider you are looking for under [facility] and page to a number that you can be directly reached. 4. If you still have difficulty reaching the provider, please page the [facility] (Director on Call) for the Hospitalists listed on amion for assistance.

## 2019-07-16 NOTE — Consult Note (Addendum)
PHARMACY CONSULT NOTE - FOLLOW UP  Pharmacy Consult for Electrolyte Monitoring and Replacement   Recent Labs: Potassium (mmol/L)  Date Value  07/16/2019 3.1 (L)   Magnesium (mg/dL)  Date Value  07/16/2019 2.1   Calcium (mg/dL)  Date Value  07/16/2019 6.7 (L)   Albumin (g/dL)  Date Value  07/14/2019 4.2   Phosphorus (mg/dL)  Date Value  07/14/2019 3.0   Sodium (mmol/L)  Date Value  07/16/2019 138    Assessment: Patient is a 58 y/o F with history of HTN and GERD who presented to [facility] ED c/o numbness in face and hands. Patient reports drinking more alcohol than usual recently. Orders for multivitamin, thiamine, folate. Labs significant for multiple electrolyte abnormalities. Scr is elevated to 1.28.    Goal of Therapy:  Replete electrolytes to normal levels  Plan:  Hypokalemia: K 3.1  Scr 0.76 -MD ordered KCL PO 40 meq q4h x 2 doses. -f/u K at 1800 and electrolytes with am labs  Hypomagnesemia: Mag 2.1 -no supplementation today  Hypocalcemia :Ca 6.7 Albumin 4.2 -MD ordered calcium gluconate 1 g IV x1 again today  -electrolytes tomorrow AM  Chinita Greenland PharmD Clinical Pharmacist 07/16/2019

## 2019-07-16 NOTE — TOC Initial Note (Signed)
Transition of Care [facility]) - Initial/Assessment Note    Patient Details  Name: Mariah Bautista MRN: [id number] Date of Birth: August 22, 1961  Transition of Care [facility]) CM/SW Contact:    Katrina Stack, RN Phone Number: 07/16/2019, 10:05 AM  Clinical Narrative:                CM consult present for "patoient drinking more than usual."  CM spoke with patient and she states that  during covid she has "probably drank more but  I'm okay." She is working full time. Denies issues with transportation, obtaining meds or accessing medical care.  She is on CIWA and scoring zero.  She declines needing any resources for etoh several times when offered.         Patient Goals and CMS Choice        Expected Discharge Plan and Services                                                Prior Living Arrangements/Services                       Activities of Daily Living Home Assistive Devices/Equipment: None ADL Screening (condition at time of admission) Patient's cognitive ability adequate to safely complete daily activities?: Yes Is the patient deaf or have difficulty hearing?: No Does the patient have difficulty seeing, even when wearing glasses/contacts?: No Does the patient have difficulty concentrating, remembering, or making decisions?: No Patient able to express need for assistance with ADLs?: Yes Does the patient have difficulty dressing or bathing?: No Independently performs ADLs?: Yes (appropriate for developmental age) Does the patient have difficulty walking or climbing stairs?: No Weakness of Legs: Both Weakness of Arms/Hands: Both  Permission Sought/Granted                  Emotional Assessment              Admission diagnosis:  Hypocalcemia [E83.51] Hypokalemia [E87.6] Hypomagnesemia [E83.42] AKI (acute kidney injury) ([place]) [N17.9] Patient Active Problem List   Diagnosis Date Noted  . Numbness 07/14/2019   PCP:  Volanda Napoleon, MD Pharmacy:    [facility] [place], [place] [facility] [address] AT [place] [facility] [place] [address] Phone: [phone] Fax: [phone]     Social Determinants of Health (SDOH) Interventions    Readmission Risk Interventions Readmission Risk Prevention Plan 07/16/2019  Post Dischage Appt Not Complete  Appt Comments No discharge at present  Medication Screening Complete  Transportation Screening Complete

## 2019-07-16 NOTE — Consult Note (Signed)
PHARMACY CONSULT NOTE - FOLLOW UP  Pharmacy Consult for Electrolyte Monitoring and Replacement   Recent Labs: Potassium (mmol/L)  Date Value  07/16/2019 3.5   Magnesium (mg/dL)  Date Value  07/16/2019 2.1   Calcium (mg/dL)  Date Value  07/16/2019 6.7 (L)   Albumin (g/dL)  Date Value  07/14/2019 4.2   Phosphorus (mg/dL)  Date Value  07/14/2019 3.0   Sodium (mmol/L)  Date Value  07/16/2019 138    Assessment: Patient is a 58 y/o F with history of HTN and GERD who presented to [facility] ED c/o numbness in face and hands. Patient reports drinking more alcohol than usual recently. Orders for multivitamin, thiamine, folate. Labs significant for multiple electrolyte abnormalities. Scr is elevated to 1.28.    Goal of Therapy:  Replete electrolytes to normal levels  Plan:  Hypokalemia: K 3.1  Scr 0.76 -MD ordered KCL PO 40 meq q4h x 2 doses. -f/u K at 1800 and electrolytes with am labs  11/1@1800 : K 3.5 -no further supplementation needed at this time.  Hypomagnesemia: Mag 2.1 -no supplementation today  Hypocalcemia :Ca 6.7 Albumin 4.2 -MD ordered calcium gluconate 1 g IV x1 again today  -electrolytes tomorrow AM  Pearla Dubonnet, PharmD Clinical Pharmacist 07/16/2019 6:45 PM

## 2019-07-16 NOTE — Plan of Care (Signed)

## 2019-07-17 LAB — BASIC METABOLIC PANEL
Anion gap: 7 (ref 5–15)
BUN: 5 mg/dL — ABNORMAL LOW (ref 6–20)
CO2: 20 mmol/L — ABNORMAL LOW (ref 22–32)
Calcium: 7.7 mg/dL — ABNORMAL LOW (ref 8.9–10.3)
Chloride: 110 mmol/L (ref 98–111)
Creatinine, Ser: 0.74 mg/dL (ref 0.44–1.00)
GFR calc Af Amer: >60 mL/min (ref >60)
GFR calc non Af Amer: >60 mL/min (ref >60)
Glucose, Bld: 90 mg/dL (ref 70–99)
Potassium: 3.6 mmol/L (ref 3.5–5.1)
Sodium: 137 mmol/L (ref 135–145)

## 2019-07-17 LAB — MAGNESIUM: Magnesium: 1.8 mg/dL (ref 1.7–2.4)

## 2019-07-17 LAB — PHOSPHORUS: Phosphorus: 2.5 mg/dL (ref 2.5–4.6)

## 2019-07-17 MED ORDER — SODIUM CHLORIDE 0.9 % IV SOLN
INTRAVENOUS | Status: DC | PRN
  Administered 2019-07-17: 08:00:00 250 mL via INTRAVENOUS

## 2019-07-17 MED ORDER — THIAMINE HCL 100 MG PO TABS: 100.0000 mg | ORAL_TABLET | Freq: Every day | ORAL | Status: AC

## 2019-07-17 MED ORDER — MAGNESIUM SULFATE 2 GM/50ML IV SOLN
2.0000 g | Freq: Once | INTRAVENOUS | Status: AC
  Administered 2019-07-17: 08:00:00 2 g via INTRAVENOUS
  Filled 2019-07-17: qty 50

## 2019-07-17 MED ORDER — POTASSIUM CHLORIDE CRYS ER 20 MEQ PO TBCR: 40.0000 meq | EXTENDED_RELEASE_TABLET | Freq: Once | ORAL | Status: DC

## 2019-07-17 MED ORDER — ADULT MULTIVITAMIN W/MINERALS CH: 1.0000 | ORAL_TABLET | Freq: Every day | ORAL | Status: AC

## 2019-07-17 MED ORDER — FOLIC ACID 1 MG PO TABS: 1.0000 mg | ORAL_TABLET | Freq: Every day | ORAL | Status: AC

## 2019-07-17 MED ORDER — CALCIUM GLUCONATE-NACL 1-0.675 GM/50ML-% IV SOLN
1.0000 g | Freq: Once | INTRAVENOUS | Status: AC
  Administered 2019-07-17: 1000 mg via INTRAVENOUS
  Filled 2019-07-17: qty 50

## 2019-07-17 MED ORDER — POTASSIUM PHOSPHATE MONOBASIC 500 MG PO TABS
500.0000 mg | ORAL_TABLET | Freq: Three times a day (TID) | ORAL | Status: DC
  Administered 2019-07-17 (×2): 500 mg via ORAL
  Filled 2019-07-17 (×3): qty 1

## 2019-07-17 NOTE — Progress Notes (Signed)
PROGRESS NOTE  Mariah Bautista RCV:[id number] DOB: 1961/06/26 DOA: 07/14/2019 PCP: Lake Bells, MD  Brief History   58 year old woman daily alcohol use presented with weakness and generalized numbness, found to have multiple electrolyte abnormalities including hypokalemia, hyponatremia, hypomagnesemia and hypocalcemia.  A & P  Hypokalemia, hypocalcemia, likely secondary to poor solute intake/alcohol use/"beer drinker's potomania".  Presented with generalized numbness secondary to electrolyte abnormalities.  CT head was negative. --Potassium, magnesium and phosphorus all within normal limits at lower end.  Calcium remains low at 7.7. --Replace potassium, magnesium, phosphorus, calcium  Diarrhea, no fever or abdominal pain.  Likely related to electrolyte repletion.  Exam benign. --Clinically.  Check this afternoon, hopefully discharge home.  Daily alcohol use --No evidence of withdrawal  Essential hypertension --Remains stable.  Continue Toprol-XL.  Stop hydrochlorothiazide on discharge.  Overall better, electrolytes stabilizing.  Following diarrhea clinically, she has no signs or symptoms to suggest bacterial infection.  Likely related to electrolyte replacement.  Home later today of diarrhea controlled.  DVT prophylaxis: enoxaparin Code Status: Full Family Communication: none Disposition Plan: home    Brendia Sacks, MD  [facility] Direct contact: see [email or web address] (further directions at bottom of note if needed) 7PM-7AM contact night coverage as at bottom of note 07/17/2019, 10:11 AM  LOS: 3 days   Significant Hospital Events   . 10/30 admitted for multiple electrolyte abnormalities   Consults:  .    Procedures:  .   Significant Diagnostic Tests:  . Admission potassium 2.4, calcium 6, sodium 130, chloride 93 . CT head no acute abnormalities   Micro Data:  . SARS Covid negative   Antimicrobials:  .   Interval History/Subjective  No nausea or vomiting.   Started having diarrhea last night and this morning.  No abdominal pain.  Watery.  Objective   Vitals:  Vitals:   07/17/19 0345 07/17/19 0729  BP: (!) 158/73 (!) 159/77  Pulse: 68 68  Resp: 17   Temp: 98 F (36.7 C)   SpO2: 95% 97%    Exam:  Constitutional: Appears calm, mildly uncomfortable, nontoxic. Eyes: Appear grossly unremarkable ENT: Grossly normal hearing Respiratory: Clear to auscultation bilaterally.  No wheezes, rales or rhonchi.  Normal respiratory effort. Cardiovascular: Regular rate and rhythm.  No murmur, rub or gallop.  No lower extremity edema Abdomen: Soft, nontender, nondistended.  Positive bowel sounds. Psychiatric: Grossly normal mood and affect.  Speech fluent and appropriate.  I have personally reviewed the following:   Today's Data  . Potassium 3.6, calcium 7.7, phosphorus 2.5, magnesium 1.8  Scheduled Meds: . enoxaparin (LOVENOX) injection  40 mg Subcutaneous Q24H  . folic acid  1 mg Oral Daily  . loratadine  10 mg Oral Daily  . LORazepam  0-4 mg Oral Q12H  . metoprolol succinate  100 mg Oral Daily  . multivitamin with minerals  1 tablet Oral Daily  . potassium phosphate (monobasic)  500 mg Oral TID WC & HS  . thiamine  100 mg Oral Daily   Or  . thiamine  100 mg Intravenous Daily   Continuous Infusions: . sodium chloride 250 mL (07/17/19 0750)    Principal Problem:   Hypokalemia Active Problems:   Numbness   Hypocalcemia   LOS: 3 days   How to contact the [facility] Attending or Consulting provider 7A - 7P or covering provider during after hours 7P -7A, for this patient?  1. Check the care team in [facility] and look for a) attending/consulting TRH provider listed and b) the  Center team listed 2. Log into [email or web address] and use [place]'s universal password to access. If you do not have the password, please contact the hospital operator. 3. Locate the [facility] provider you are looking for under [facility] and page to a number that you can be  directly reached. 4. If you still have difficulty reaching the provider, please page the [facility] (Director on Call) for the Hospitalists listed on amion for assistance.

## 2019-07-17 NOTE — Discharge Summary (Signed)
Physician Discharge Summary  Mariah Bautista NWG:[id number] DOB: [birth year] DOA: 07/14/2019  PCP: Volanda Napoleon, MD  Admit date: 07/14/2019 Discharge date: 07/17/2019  Recommendations for Outpatient Follow-up:   Hypokalemia, hypocalcemia, hypomagnesemia, hypophosphatemia --Consider repeat BMP and electrolyte studies in a week to ensure ability to maintain normal levels with diet.  Immoderate alcohol use --Follow-up in the outpatient setting  Follow-up Information    Volanda Napoleon, MD. Go on 07/25/2019.   Why: @3 :00 PM  Contact information: [place] [id number] [phone]            Discharge Diagnoses: Principal diagnosis is #1  1. Hypokalemia, hypocalcemia, hypophosphatemia, hypomagnesemia 2. Immoderate alcohol use 3. Essential hypertension  Discharge Condition: improved Disposition: home  Diet recommendation: heart healthy  Filed Weights   07/14/19 1354 07/14/19 2301  Weight: 104.3 kg 102.7 kg    History of present illness:  58 year old woman daily alcohol use presented with weakness and generalized numbness, found to have multiple electrolyte abnormalities including hypokalemia, hyponatremia, hypomagnesemia and hypocalcemia.  Hospital Course:  Patient had been eating poorly for some time, had suffered from diarrhea within the last 2 months and had been drinking substantial alcohol.  She was aggressively repleted for calcium magnesium phosphorus and potassium with gradual clinical improvement.  On discharge she reports she should be able to eat and drink fluids well.  Hypokalemia, hypocalcemia, likely secondary to poor solute intake/alcohol use/"beer drinker's potomania".  Presented with generalized numbness secondary to electrolyte abnormalities.  CT head was negative. --Potassium magnesium and phosphorus corrected.  Calcium now near normal.  Expect to be maintained with normal diarrhea in the outpatient setting.  Diarrhea, no fever or  abdominal pain.  Likely related to electrolyte repletion.  Exam benign. --Diarrhea starting to form up.  No evidence of infection, fever or significant pain.  No further evaluation suggested.  Immoderate alcohol use --No evidence of withdrawal --Follow-up as an outpatient  Essential hypertension --Remains stable.  Continue Toprol-XL.    Okay to resume hydrochlorothiazide so long as she maintains her diet.  Discharge home on vitamins including multivitamin, thiamine and folate.  Discussed need to continue calcium which the patient reports she already takes at home.  Significant Hospital Events    10/30 admitted for multiple electrolyte abnormalities  Significant Diagnostic Tests:   Admission potassium 2.4, calcium 6, sodium 130, chloride 93  CT head no acute abnormalities  Micro Data:   SARS Covid negative   Today's assessment: See progress note same day   Discharge Instructions  Discharge Instructions    Diet - low sodium heart healthy   Complete by: As directed    Discharge instructions   Complete by: As directed    Call your physician or seek immediate medical attention for weakness, numbness or worsening of condition.   Increase activity slowly   Complete by: As directed      Allergies as of 07/17/2019      Reactions   Codeine Nausea And Vomiting      Medication List    TAKE these medications   ALPRAZolam 0.25 MG tablet Commonly known as: XANAX Take 0.25 mg by mouth daily as needed.   cholecalciferol 25 MCG (1000 UT) tablet Commonly known as: VITAMIN D3 Take 1,000 Units by mouth daily.   fexofenadine 180 MG tablet Commonly known as: ALLEGRA Take 180 mg by mouth daily.   fluticasone 50 MCG/ACT nasal spray Commonly known as: FLONASE Place 1 spray into both nostrils daily.   folic acid 1  MG tablet Commonly known as: FOLVITE Take 1 tablet (1 mg total) by mouth daily. Start taking on: July 18, 2019   lisinopril-hydrochlorothiazide 20-12.5  MG tablet Commonly known as: ZESTORETIC Take 2 tablets by mouth daily.   multivitamin with minerals Tabs tablet Take 1 tablet by mouth daily. Start taking on: July 18, 2019   naproxen sodium 220 MG tablet Commonly known as: ALEVE Take 440 mg by mouth daily as needed.   omeprazole 40 MG capsule Commonly known as: PRILOSEC Take 40 mg by mouth daily.   thiamine 100 MG tablet Take 1 tablet (100 mg total) by mouth daily. Start taking on: July 18, 2019   Toprol XL 100 MG 24 hr tablet Generic drug: metoprolol succinate Take 100 mg by mouth daily.   vitamin C 250 MG tablet Commonly known as: ASCORBIC ACID Take 250 mg by mouth daily.      Allergies  Allergen Reactions  . Codeine Nausea And Vomiting    The results of significant diagnostics from this hospitalization (including imaging, microbiology, ancillary and laboratory) are listed below for reference.    Significant Diagnostic Studies: Ct Head Wo Contrast  Result Date: 07/14/2019 CLINICAL DATA:  Numbness and tingling, paresthesias, acute numbness from her neck down. No known injury. EXAM: CT HEAD WITHOUT CONTRAST TECHNIQUE: Contiguous axial images were obtained from the base of the skull through the vertex without intravenous contrast. COMPARISON:  None. FINDINGS: Brain: No evidence of acute infarction, hemorrhage, hydrocephalus, extra-axial collection or mass lesion/mass effect. 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 Vascular: No hyperdense vessel or unexpected calcification. Skull: No osseous abnormality. Sinuses/Orbits: Visualized paranasal sinuses are clear. Visualized mastoid sinuses are clear. Visualized orbits demonstrate no focal abnormality. Other: None IMPRESSION: No acute intracranial pathology. Electronically Signed   By: Elige Ko   On: 07/14/2019 14:24    Microbiology: Recent Results (from the past 240 hour(s))  SARS CORONAVIRUS 2 (TAT 6-24 HRS)  Nasopharyngeal Nasopharyngeal Swab     Status: None   Collection Time: 07/14/19  3:09 PM   Specimen: Nasopharyngeal Swab  Result Value Ref Range Status   SARS Coronavirus 2 NEGATIVE NEGATIVE Final    Comment: (NOTE) SARS-CoV-2 target nucleic acids are NOT DETECTED. The SARS-CoV-2 RNA is generally detectable in upper and lower respiratory specimens during the acute phase of infection. Negative results do not preclude SARS-CoV-2 infection, do not rule out co-infections with other pathogens, and should not be used as the sole basis for treatment or other patient management decisions. Negative results must be combined with clinical observations, patient history, and epidemiological information. The expected result is Negative. Fact Sheet for Patients: HairSlick.no Fact Sheet for Healthcare Providers: quierodirigir.com This test is not yet approved or cleared by the [place] FDA and  has been authorized for detection and/or diagnosis of SARS-CoV-2 by FDA under an Emergency Use Authorization (EUA). This EUA will remain  in effect (meaning this test can be used) for the duration of the COVID-19 declaration under Section 56 4(b)(1) of the Act, 21 U.S.C. section 360bbb-3(b)(1), unless the authorization is terminated or revoked sooner. Performed at [facility], [address], [place], [place] [address]      Labs: Basic Metabolic Panel: Recent Labs  Lab 07/14/19 1358 07/14/19 1459 07/14/19 2320 07/15/19 0851 07/15/19 1747 07/16/19 0622 07/16/19 1804 07/17/19 0403  NA 130*  --   --  135  --  138  --  137  K 2.4*  --  2.5* 2.6* 3.2* 3.1* 3.5 3.6  CL 93*  --   --  102  --  109  --  110  CO2 18*  --   --  19*  --  21*  --  20*  GLUCOSE 122*  --   --  114*  --  112*  --  90  BUN 14  --   --  8  --  5*  --  5*  CREATININE 1.28*  --   --  1.04*  --  0.76  --  0.74  CALCIUM 6.0*  --   --  6.2*  --  6.7*  --  7.7*  MG   --  0.5* 1.9 1.6*  --  2.1  --  1.8  PHOS 4.1  --  3.0  --   --   --   --  2.5   Liver Function Tests: Recent Labs  Lab 07/14/19 1358  AST 40  ALT 36  ALKPHOS 54  BILITOT 0.7  PROT 7.9  ALBUMIN 4.2   CBC: Recent Labs  Lab 07/14/19 1358  WBC 15.3*  NEUTROABS 12.6*  HGB 13.8  HCT 39.4  MCV 92.7  PLT 307   CBG: Recent Labs  Lab 07/14/19 1349  GLUCAP 124*    Principal Problem:   Hypokalemia Active Problems:   Numbness   Hypocalcemia   Time coordinating discharge: 35 minutes  Signed:  Brendia Sacksaniel Arelyn Gauer, MD  [facility]  07/17/2019, 5:37 PM

## 2019-07-17 NOTE — Plan of Care (Signed)

## 2021-04-07 IMAGING — CT CT HEAD W/O CM
3 series · 16 of 46 positions shown, 19 images · non-contrast
Comparison: None.

CLINICAL DATA: Numbness and tingling, paresthesias, acute numbness
from her neck down. No known injury.

EXAM:
CT HEAD WITHOUT CONTRAST
TECHNIQUE: Contiguous axial images were obtained from the base of the skull
through the vertex without intravenous contrast.

[Series 2: head wo · axial · 0.40mm/px · z∈[+506,+626]mm · 10 of 29 slices shown, 13 images]
[im 3/29  brain]
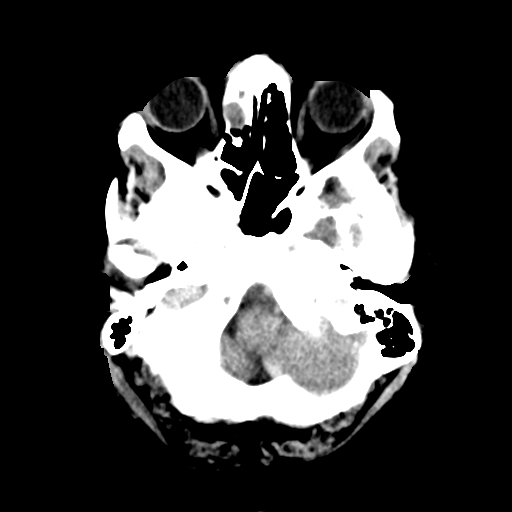
[im 3/29  bone]
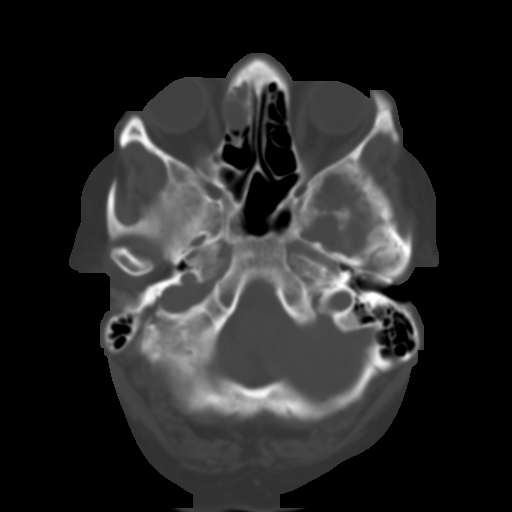
[im 6/29  brain]
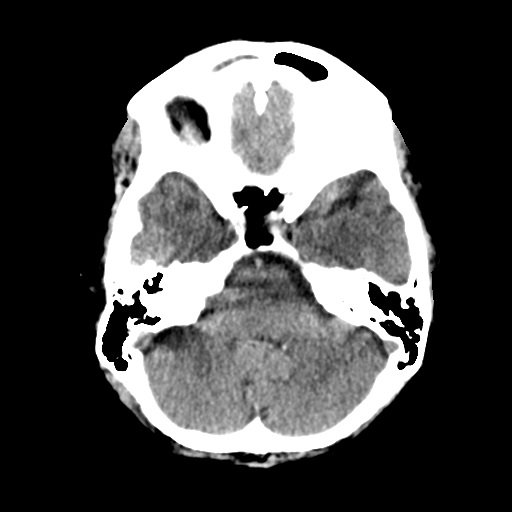
[im 8/29  brain]
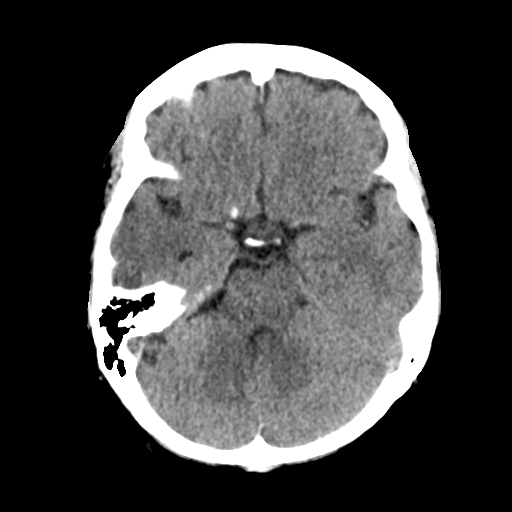
[im 11/29  brain]
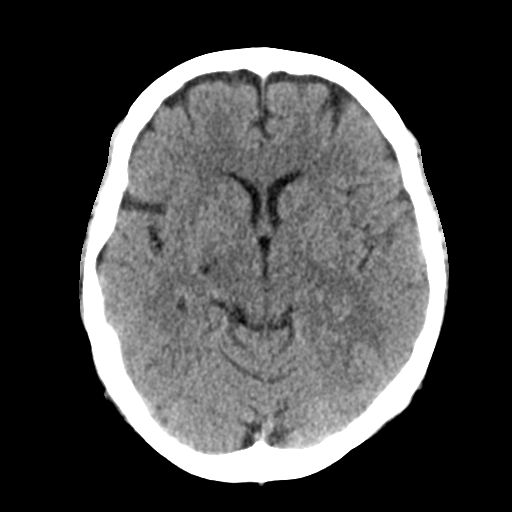
[im 14/29  brain]
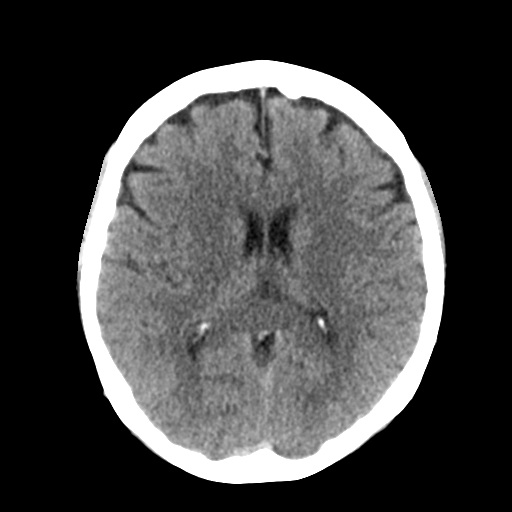
[im 14/29  bone]
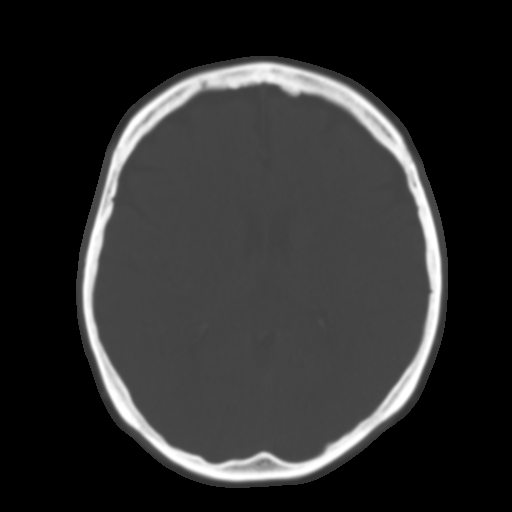
[im 16/29  brain]
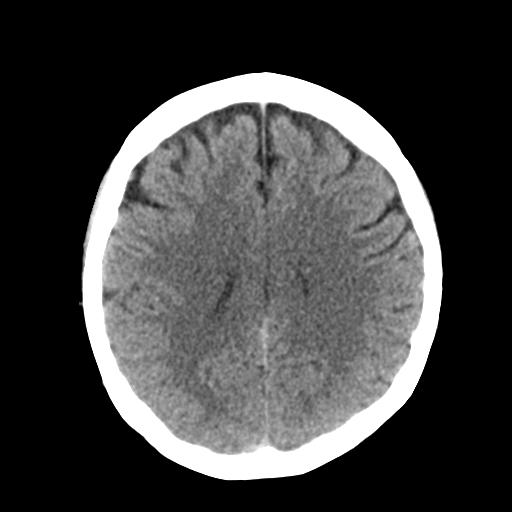
[im 19/29  brain]
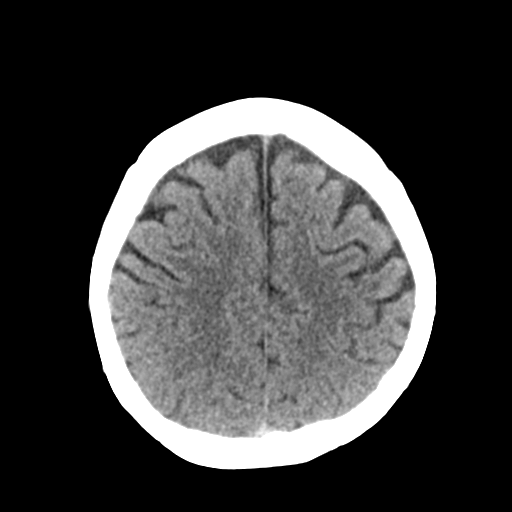
[im 22/29  brain]
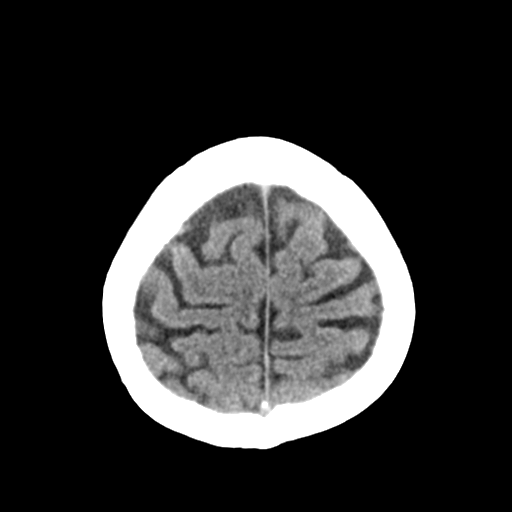
[im 24/29  brain]
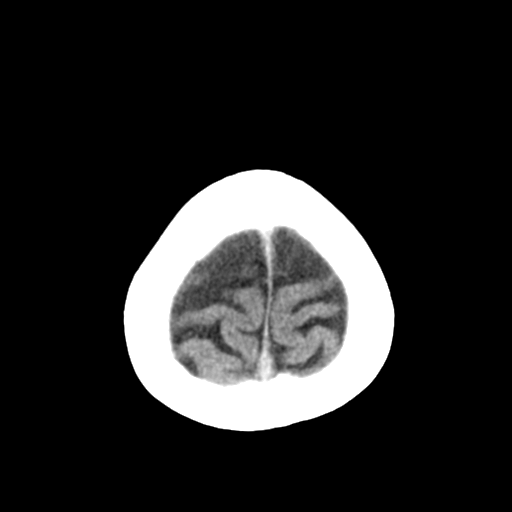
[im 24/29  bone]
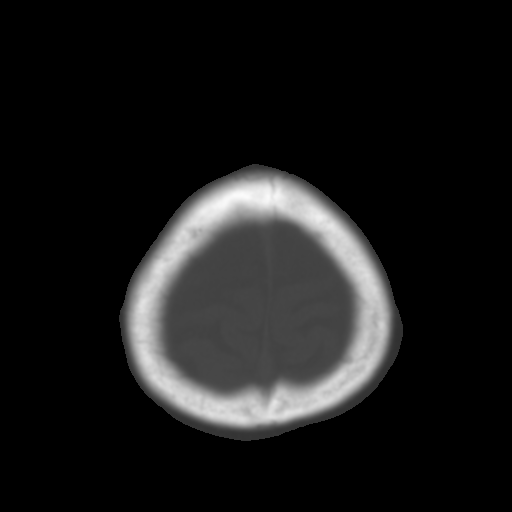
[im 27/29  brain]
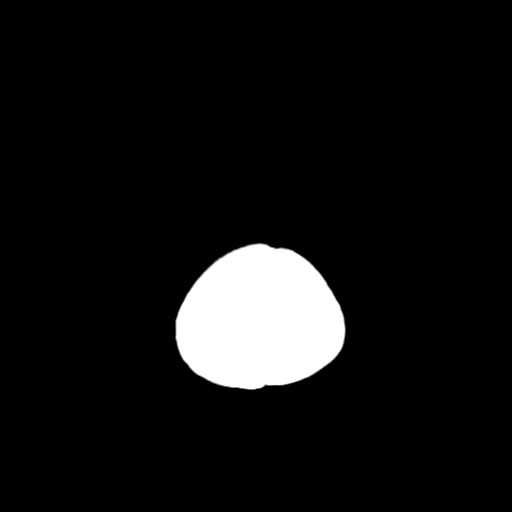

[Series 4: coronal soft tissue · coronal · 0.30mm/px · 3 of 60 slices shown]
[im 20/60  brain]
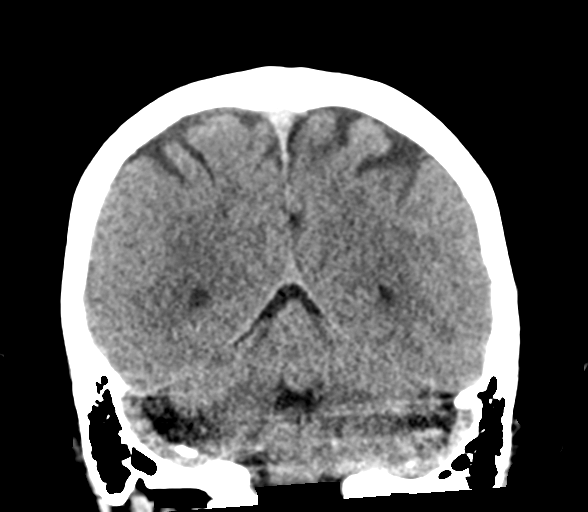
[im 27/60  brain]
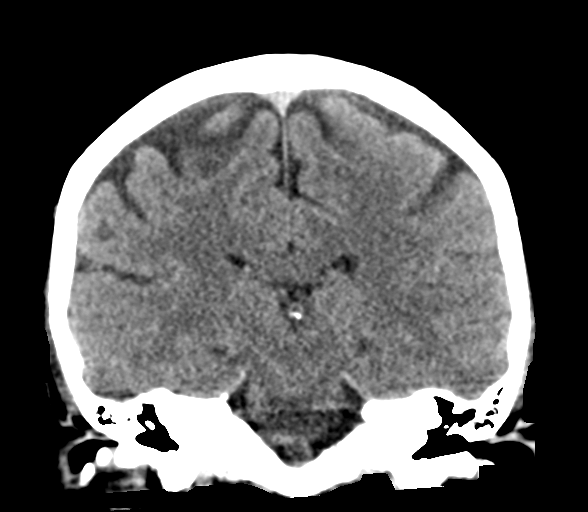
[im 33/60  brain]
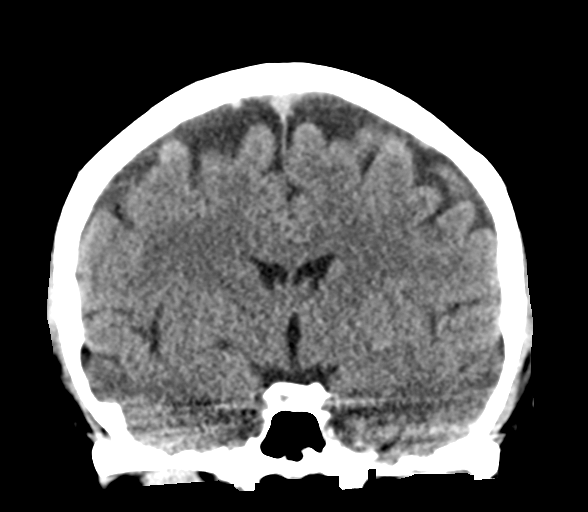

[Series 5: sagittal soft tissue · sagittal · 0.30mm/px · 3 of 54 slices shown]
[im 18/54  brain]
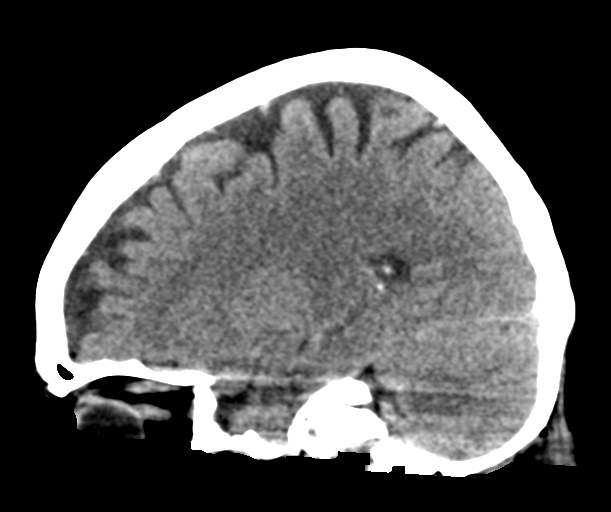
[im 27/54  brain]
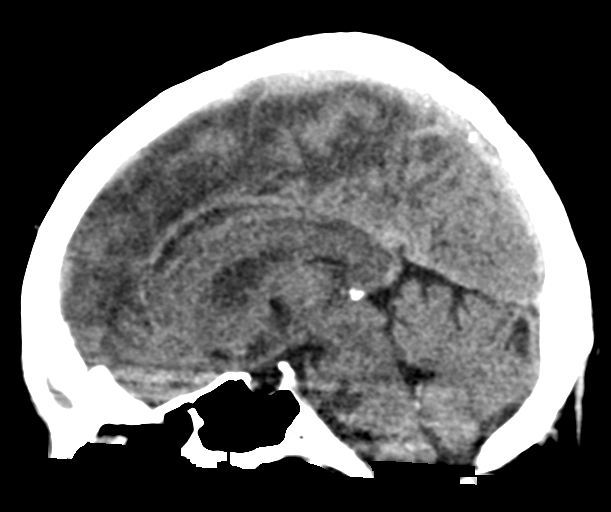
[im 36/54  brain]
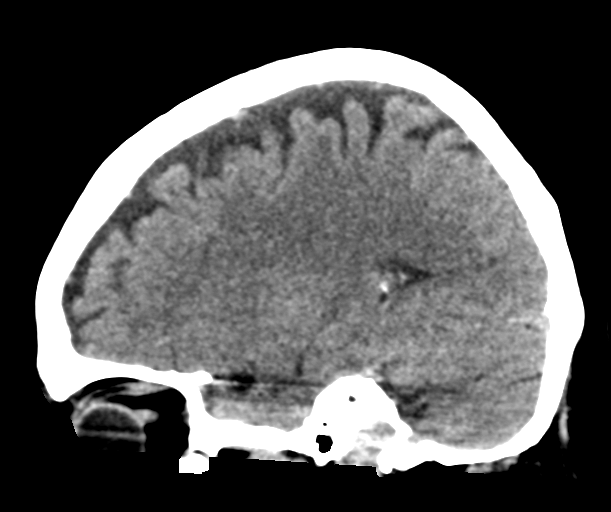

[16 of 46 positions shown; findings below may reference images not displayed]

FINDINGS: Brain: No evidence of acute infarction, hemorrhage, hydrocephalus,
extra-axial collection or mass lesion/mass effect. Small cystic area
adjacent to the right thalamus likely reflecting a [place]
space.

Vascular: No hyperdense vessel or unexpected calcification.

Skull: No osseous abnormality.

Sinuses/Orbits: Visualized paranasal sinuses are clear. Visualized
mastoid sinuses are clear. Visualized orbits demonstrate no focal
abnormality.

Other: None
IMPRESSION: No acute intracranial pathology.
